# Patient Record
Sex: Male | Born: 1985 | Race: Black or African American | Hispanic: No | Marital: Single | Smoking: Never smoker
Health system: Southern US, Community
[De-identification: ages and names within clinical notes are randomized; demographics above are authoritative.]

---
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
## 2008-06-21 ENCOUNTER — Ambulatory Visit: Payer: Self-pay | Admitting: Diagnostic Radiology

## 2008-06-21 ENCOUNTER — Emergency Department (HOSPITAL_BASED_OUTPATIENT_CLINIC_OR_DEPARTMENT_OTHER): Admission: EM | Admit: 2008-06-21 | Discharge: 2008-06-21 | Payer: Self-pay | Admitting: Emergency Medicine

## 2013-09-20 ENCOUNTER — Encounter (HOSPITAL_BASED_OUTPATIENT_CLINIC_OR_DEPARTMENT_OTHER): Payer: Self-pay | Admitting: Emergency Medicine

## 2013-09-20 ENCOUNTER — Emergency Department (HOSPITAL_BASED_OUTPATIENT_CLINIC_OR_DEPARTMENT_OTHER)
Admission: EM | Admit: 2013-09-20 | Discharge: 2013-09-21 | Disposition: A | Discharge status: Home / Self Care | Payer: Worker's Compensation | Attending: Emergency Medicine | Admitting: Emergency Medicine

## 2013-09-20 ENCOUNTER — Emergency Department (HOSPITAL_BASED_OUTPATIENT_CLINIC_OR_DEPARTMENT_OTHER): Payer: Worker's Compensation

## 2013-09-20 DIAGNOSIS — W010XXA Fall on same level from slipping, tripping and stumbling without subsequent striking against object, initial encounter: Secondary | ICD-10-CM | POA: Insufficient documentation

## 2013-09-20 DIAGNOSIS — S8392XA Sprain of unspecified site of left knee, initial encounter: Secondary | ICD-10-CM

## 2013-09-20 DIAGNOSIS — S8990XA Unspecified injury of unspecified lower leg, initial encounter: Secondary | ICD-10-CM | POA: Diagnosis present

## 2013-09-20 DIAGNOSIS — Y990 Civilian activity done for income or pay: Secondary | ICD-10-CM | POA: Insufficient documentation

## 2013-09-20 DIAGNOSIS — Y9289 Other specified places as the place of occurrence of the external cause: Secondary | ICD-10-CM | POA: Diagnosis not present

## 2013-09-20 DIAGNOSIS — IMO0002 Reserved for concepts with insufficient information to code with codable children: Secondary | ICD-10-CM | POA: Insufficient documentation

## 2013-09-20 MED ORDER — NAPROXEN 250 MG PO TABS
500.0000 mg | ORAL_TABLET | Freq: Once | ORAL | Status: AC
  Administered 2013-09-21: 500 mg via ORAL
  Filled 2013-09-20: qty 2

## 2013-09-20 MED ORDER — NAPROXEN SODIUM 550 MG PO TABS: ORAL_TABLET | ORAL | Status: AC

## 2013-09-20 NOTE — ED Provider Notes (Signed)
CSN: [id number]     Arrival date & time 09/20/13  2237 History  This chart was scribed for Hector SeamenJohn L Camala Talwar, MD by Bronson CurbJacqueline Melvin, ED Scribe. This patient was seen in room MH04/MH04 and the patient's care was started at 11:52 PM.     Chief Complaint  Patient presents with  . Knee Injury     The history is provided by the patient. No language interpreter was used.    HPI Comments: Hector Hendrix is a 28 y.o. male who presents to the Emergency Department complaining of left knee injury that occurred at approximately 1800. Patient states he slipped on some water at work and fell. There is associated mild left knee pain when ambulating. He denies any other injuries. Patient has no history of significant health conditions.  History reviewed. No pertinent past medical history. History reviewed. No pertinent past surgical history. No family history on file. History  Substance Use Topics  . Smoking status: Never Smoker   . Smokeless tobacco: Not on file  . Alcohol Use: No    Review of Systems A complete 10 system review of systems was obtained and all systems are negative except as noted in the HPI and PMH.   Allergies  Review of patient's allergies indicates no known allergies.  Home Medications   Prior to Admission medications   Medication Sig Start Date End Date Taking? Authorizing Provider  naproxen sodium (ANAPROX DS) 550 MG tablet Take one tablet every 12 hours as needed for pain. Best taken with a meal. 09/20/13   Carlisle BeersJohn L Deniz Eskridge, MD   Triage Vitals: BP 130/94  Pulse 74  Temp(Src) 98.4 F (36.9 C) (Oral)  Resp 18  Ht 5\' 11"  (1.803 m)  Wt 180 lb (81.647 kg)  BMI 25.12 kg/m2  SpO2 100%  Physical Exam  Nursing note and vitals reviewed.  General: Well-developed, well-nourished male in no acute distress; appearance consistent with age of record HENT: normocephalic; atraumatic Eyes: pupils equal, round and reactive to light; extraocular muscles intact Neck: supple Heart:  regular rate and rhythm Lungs: clear to auscultation bilaterally Abdomen: soft; nondistended; nontender; no masses or hepatosplenomegaly; bowel sounds present Extremities: No deformity; full range of motion; pulses normal. Left knee is stable with mild infrapatellar tenderness. Neurologic: Awake, alert and oriented; motor function intact in all extremities and symmetric; no facial droop Skin: Warm and dry Psychiatric: Normal mood and affect   ED Course  Procedures (including critical care time)  DIAGNOSTIC STUDIES: Oxygen Saturation is 100% on room air, normal by my interpretation.    COORDINATION OF CARE: 11:56 PM- Pt advised of plan for treatment and pt agrees.    MDM  Nursing notes and vitals signs, including pulse oximetry, reviewed.  Summary of this visit's results, reviewed by myself:  Imaging Studies: Dg Knee Complete 4 Views Left  09/20/2013   CLINICAL DATA:  Diffuse knee pain after falling at work today.  EXAM: LEFT KNEE - COMPLETE 4+ VIEW  COMPARISON:  06/21/2008.  FINDINGS: The mineralization and alignment are normal. There is no evidence of acute fracture or dislocation. The joint spaces are maintained. There are chronic endosteal lesions within the distal femur and proximal tibia which are unchanged. A small joint effusion is likely.  IMPRESSION: No acute osseous findings. Possible small joint effusion. Stable underlying fibroxanthomas.   Electronically Signed   By: Roxy HorsemanBill  Veazey M.D.   On: 09/20/2013 23:23    Final diagnoses:  Sprain of left knee, initial encounter   I personally  performed the services described in this documentation, which was scribed in my presence. The recorded information has been reviewed and is accurate.   Carlisle Beers Milam Allbaugh, MD 09/21/13 0001

## 2013-09-20 NOTE — ED Notes (Signed)
Slipped at work and fell this evening. Pain in his left knee.

## 2013-10-06 ENCOUNTER — Encounter: Payer: Self-pay | Admitting: Family Medicine

## 2013-10-06 ENCOUNTER — Ambulatory Visit (INDEPENDENT_AMBULATORY_CARE_PROVIDER_SITE_OTHER): Payer: Worker's Compensation | Admitting: Family Medicine

## 2013-10-06 VITALS — BP 117/78 | HR 88 | Wt 180.0 lb

## 2013-10-06 DIAGNOSIS — S99929A Unspecified injury of unspecified foot, initial encounter: Secondary | ICD-10-CM

## 2013-10-06 DIAGNOSIS — S8992XA Unspecified injury of left lower leg, initial encounter: Secondary | ICD-10-CM

## 2013-10-06 DIAGNOSIS — S8990XA Unspecified injury of unspecified lower leg, initial encounter: Secondary | ICD-10-CM | POA: Diagnosis not present

## 2013-10-06 DIAGNOSIS — S99919A Unspecified injury of unspecified ankle, initial encounter: Secondary | ICD-10-CM

## 2013-10-06 NOTE — Patient Instructions (Signed)
You at minimum tore meniscus in your knee. We will go ahead with an MRI of your knee - I will call you the business day following this to go over results. Icing 15 minutes at a time 3-4 times a day. Brace for support. Ok to continue working in the meantime. Aleve 2 tabs twice a day with food OR ibuprofen 3 tabs three times a day with food for pain and inflammation. Follow up will depend on the MRI results.

## 2013-10-10 ENCOUNTER — Encounter: Payer: Self-pay | Admitting: Family Medicine

## 2013-10-10 ENCOUNTER — Other Ambulatory Visit: Payer: Self-pay | Admitting: *Deleted

## 2013-10-10 DIAGNOSIS — S8992XA Unspecified injury of left lower leg, initial encounter: Secondary | ICD-10-CM

## 2013-10-10 NOTE — Progress Notes (Signed)
Patient ID: Hector Hendrix, male   DOB: 11/24/1985, 28 y.o.   MRN: [id number]  PCP: No primary provider on file.  Subjective:   HPI: Patient is a 28 y.o. male here for left knee injury.  Patient reports on 7/21 while at work at [employer] he slipped on water on a tile floor and hyperextended his left knee. No prior injuries. + swelling but no bruising. Wearing knee brace since then. Feels a little unstable. No catching or locking.  History reviewed. No pertinent past medical history.  Current Outpatient Prescriptions on File Prior to Visit  Medication Sig Dispense Refill  . naproxen sodium (ANAPROX DS) 550 MG tablet Take one tablet every 12 hours as needed for pain. Best taken with a meal.  20 tablet  0   No current facility-administered medications on file prior to visit.    History reviewed. No pertinent past surgical history.  No Known Allergies  History   Social History  . Marital Status: Single    Spouse Name: N/A    Number of Children: N/A  . Years of Education: N/A   Occupational History  . Not on file.   Social History Main Topics  . Smoking status: Never Smoker   . Smokeless tobacco: Not on file  . Alcohol Use: No  . Drug Use: No  . Sexual Activity: Not on file   Other Topics Concern  . Not on file   Social History Narrative  . No narrative on file    No family history on file.  BP 117/78  Pulse 88  Wt 180 lb (81.647 kg)  Review of Systems: See HPI above.    Objective:  Physical Exam:  Gen: NAD  Left knee: Mod effusion.  No bruising, other deformity. Medial, lateral joint line and suprapatellar tenderness.  ROM 0 - 110 degrees - limited by swelling. 1+ ant drawer, negative post drawer.  Negative valgus/varus testing. Negative lachmanns. Positive mcmurrays, apleys.  Negative patellar apprehension. NV intact distally.    Assessment & Plan:  1. Left knee injury - concerning for at least meniscus tear.  Possible ACL tear based on exam, type  of injury and instability.  Will go ahead with MRI to confirm, refer to orthopedic surgery if confirmed.  In meantime brace, icing, nsaids.

## 2013-10-10 NOTE — Assessment & Plan Note (Signed)
concerning for at least meniscus tear.  Possible ACL tear based on exam, type of injury and instability.  Will go ahead with MRI to confirm, refer to orthopedic surgery if confirmed.  In meantime brace, icing, nsaids.

## 2014-01-04 ENCOUNTER — Ambulatory Visit (INDEPENDENT_AMBULATORY_CARE_PROVIDER_SITE_OTHER): Payer: Worker's Compensation | Admitting: Family Medicine

## 2014-01-04 DIAGNOSIS — S83512S Sprain of anterior cruciate ligament of left knee, sequela: Secondary | ICD-10-CM

## 2014-01-04 DIAGNOSIS — S8992XD Unspecified injury of left lower leg, subsequent encounter: Secondary | ICD-10-CM

## 2014-01-04 NOTE — Patient Instructions (Signed)
Start physical therapy and do home exercises on days you don't go to physical therapy. Follow up with me about 6 weeks following this for reevaluation.

## 2014-01-09 NOTE — Progress Notes (Signed)
Patient ID: Hector Hendrix, male   DOB: 10/09/1985, 28 y.o.   MRN: [id number]  PCP: No primary care provider on file.  Subjective:   HPI: Patient is a 28 y.o. male here for left knee injury.  8/6: Patient reports on 7/21 while at work at [employer] he slipped on water on a tile floor and hyperextended his left knee. No prior injuries. + swelling but no bruising. Wearing knee brace since then. Feels a little unstable. No catching or locking.  11/4: Patient returns to review MRI and to reevaluate/reexamine. Reports pain comes on with certain activities, prolonged walking, standing, side to side motions. No pain currently. No swelling any longer. Has not given out, no catching or locking.  No past medical history on file.  Current Outpatient Prescriptions on File Prior to Visit  Medication Sig Dispense Refill  . naproxen sodium (ANAPROX DS) 550 MG tablet Take one tablet every 12 hours as needed for pain. Best taken with a meal. 20 tablet 0   No current facility-administered medications on file prior to visit.    No past surgical history on file.  No Known Allergies  History   Social History  . Marital Status: Single    Spouse Name: N/A    Number of Children: N/A  . Years of Education: N/A   Occupational History  . Not on file.   Social History Main Topics  . Smoking status: Never Smoker   . Smokeless tobacco: Not on file  . Alcohol Use: No  . Drug Use: No  . Sexual Activity: Not on file   Other Topics Concern  . Not on file   Social History Narrative  . No narrative on file    No family history on file.  There were no vitals taken for this visit.  Review of Systems: See HPI above.    Objective:  Physical Exam:  Gen: NAD  Left knee: No effusion, bruising, other deformity. No joint line or post patellar facet tenderness.  FROM. Trace ant drawer, negative post drawer.  Negative valgus/varus testing. Negative lachmanns. Negative mcmurrays, apleys.   Negative patellar apprehension. NV intact distally.    Assessment & Plan:  1. Left knee injury - MRI performed 01/01/2014 shows degeneration of ACL with likely partial tear - agree with this finding from his accident.  Believe he has some fibers intact given his exam.  F/u in 6 weeks to reevaluate.

## 2014-01-09 NOTE — Assessment & Plan Note (Signed)
MRI performed 01/01/2014 shows degeneration of ACL with likely partial tear - agree with this finding from his accident.  Believe he has some fibers intact given his exam.  F/u in 6 weeks to reevaluate.

## 2014-01-31 ENCOUNTER — Encounter: Payer: Self-pay | Admitting: Family Medicine

## 2014-02-15 ENCOUNTER — Ambulatory Visit: Payer: Self-pay | Admitting: Family Medicine

## 2014-02-21 ENCOUNTER — Encounter: Payer: Self-pay | Admitting: Family Medicine

## 2014-02-21 ENCOUNTER — Ambulatory Visit (INDEPENDENT_AMBULATORY_CARE_PROVIDER_SITE_OTHER): Payer: Worker's Compensation | Admitting: Family Medicine

## 2014-02-21 VITALS — BP 143/74 | HR 80 | Wt 180.0 lb

## 2014-02-21 DIAGNOSIS — S8992XD Unspecified injury of left lower leg, subsequent encounter: Secondary | ICD-10-CM

## 2014-02-21 NOTE — Patient Instructions (Signed)
Continue with physical therapy as you transition to a home exercise program. When you do, do the home exercises 3-4 times a week until I see you back. Follow up with me in 3 months. Call me if you have any problems in the meantime.

## 2014-02-21 NOTE — Progress Notes (Signed)
Patient ID: Hector Hendrix, male   DOB: 06/12/1985, 28 y.o.   MRN: [id number]  PCP: No primary care provider on file.  Subjective:   HPI: Patient is a [age] y.o. male here for left knee injury.  8/6: Patient reports on 7/21 while at work at [employer] he slipped on water on a tile floor and hyperextended his left knee. No prior injuries. + swelling but no bruising. Wearing knee brace since then. Feels a little unstable. No catching or locking.  11/4: Patient returns to review MRI and to reevaluate/reexamine. Reports pain comes on with certain activities, prolonged walking, standing, side to side motions. No pain currently. No swelling any longer. Has not given out, no catching or locking.  12/22: Patient reports he is doing well. Gets mild pain that comes and goes. Not playing sports. No catching, locking, giving out. No swelling. Doing physical therapy and home exercises.  No past medical history on file.  Current Outpatient Prescriptions on File Prior to Visit  Medication Sig Dispense Refill  . naproxen sodium (ANAPROX DS) 550 MG tablet Take one tablet every 12 hours as needed for pain. Best taken with a meal. 20 tablet 0   No current facility-administered medications on file prior to visit.    No past surgical history on file.  No Known Allergies  History   Social History  . Marital Status: Single    Spouse Name: N/A    Number of Children: N/A  . Years of Education: N/A   Occupational History  . Not on file.   Social History Main Topics  . Smoking status: Never Smoker   . Smokeless tobacco: Not on file  . Alcohol Use: No  . Drug Use: No  . Sexual Activity: Not on file   Other Topics Concern  . Not on file   Social History Narrative    No family history on file.  BP 143/74 mmHg  Pulse 80  Wt 180 lb (81.647 kg)  Review of Systems: See HPI above.    Objective:  Physical Exam:  Gen: NAD  Left knee: No effusion, bruising, other deformity. No  joint line or post patellar facet tenderness.  FROM. 1+ ant drawer, negative post drawer.  Negative valgus/varus testing. Negative lachmanns. Negative mcmurrays, apleys.  Negative patellar apprehension. NV intact distally.    Assessment & Plan:  1. Left knee injury - MRI performed 01/01/2014 shows degeneration of ACL with likely partial tear - agree with this finding from his accident.  Had no other injury to this knee to account for these findings previously.  Will reassess his status at follow-up in 3 months.  Continue with PT and over next few weeks will likely transition to HEP.  Discussed there is a possibility he would need surgical intervention for this though symptomatically he has done well to date.

## 2014-02-21 NOTE — Assessment & Plan Note (Signed)
MRI performed 01/01/2014 shows degeneration of ACL with likely partial tear - agree with this finding from his accident.  Had no other injury to this knee to account for these findings previously.  Will reassess his status at follow-up in 3 months.  Continue with PT and over next few weeks will likely transition to HEP.  Discussed there is a possibility he would need surgical intervention for this though symptomatically he has done well to date.

## 2014-05-23 ENCOUNTER — Ambulatory Visit: Payer: Self-pay | Admitting: Family Medicine

## 2014-05-24 ENCOUNTER — Ambulatory Visit: Payer: Worker's Compensation | Admitting: Family Medicine

## 2014-05-31 ENCOUNTER — Encounter: Payer: Self-pay | Admitting: Family Medicine

## 2014-05-31 ENCOUNTER — Ambulatory Visit (INDEPENDENT_AMBULATORY_CARE_PROVIDER_SITE_OTHER): Payer: Worker's Compensation | Admitting: Family Medicine

## 2014-05-31 VITALS — BP 135/82 | HR 78 | Ht 68.0 in | Wt 190.0 lb

## 2014-05-31 DIAGNOSIS — S8992XD Unspecified injury of left lower leg, subsequent encounter: Secondary | ICD-10-CM

## 2014-06-05 NOTE — Progress Notes (Signed)
Patient ID: Tacey HeapOryan Matassa, male   DOB: 12/20/1985, 29 y.o.   MRN: [id number]  PCP: No primary care provider on file.  Subjective:   HPI: Patient is a 29 y.o. male here for left knee injury.  8/6: Patient reports on 7/21 while at work at [employer] he slipped on water on a tile floor and hyperextended his left knee. No prior injuries. + swelling but no bruising. Wearing knee brace since then. Feels a little unstable. No catching or locking.  11/4: Patient returns to review MRI and to reevaluate/reexamine. Reports pain comes on with certain activities, prolonged walking, standing, side to side motions. No pain currently. No swelling any longer. Has not given out, no catching or locking.  12/22: Patient reports he is doing well. Gets mild pain that comes and goes. Not playing sports. No catching, locking, giving out. No swelling. Doing physical therapy and home exercises.  05/31/14: Patient reports he is doing well. Sometimes gets pain in right knee. No catching, locking, giving out. Is about to return to working at [employer] - hasn't done anything to date to stress this knee. No other complaints.  No past medical history on file.  Current Outpatient Prescriptions on File Prior to Visit  Medication Sig Dispense Refill  . naproxen sodium (ANAPROX DS) 550 MG tablet Take one tablet every 12 hours as needed for pain. Best taken with a meal. 20 tablet 0   No current facility-administered medications on file prior to visit.    No past surgical history on file.  No Known Allergies  History   Social History  . Marital Status: Single    Spouse Name: N/A  . Number of Children: N/A  . Years of Education: N/A   Occupational History  . Not on file.   Social History Main Topics  . Smoking status: Never Smoker   . Smokeless tobacco: Not on file  . Alcohol Use: No  . Drug Use: No  . Sexual Activity: Not on file   Other Topics Concern  . Not on file   Social History  Narrative    No family history on file.  BP 135/82 mmHg  Pulse 78  Ht 5\' 8"  (1.727 m)  Wt 190 lb (86.183 kg)  BMI 28.90 kg/m2  Review of Systems: See HPI above.    Objective:  Physical Exam:  Gen: NAD  Left knee: No effusion, bruising, other deformity. No joint line or post patellar facet tenderness.  FROM. 1+ ant drawer, negative post drawer.  Negative valgus/varus testing. Negative lachmanns. Negative mcmurrays, apleys.  Negative patellar apprehension. NV intact distally.    Assessment & Plan:  1. Left knee injury - MRI performed 01/01/2014 shows degeneration of ACL with likely partial tear - agree with this finding from his accident.  Had no other injury to this knee to account for these findings previously.  He has intermittent pain though no instability.  Biggest test will be as he returns to working at [employer].  If he does well out to his 1 year followup in July, would release him.

## 2014-06-05 NOTE — Assessment & Plan Note (Signed)
MRI performed 01/01/2014 shows degeneration of ACL with likely partial tear - agree with this finding from his accident.  Had no other injury to this knee to account for these findings previously.  He has intermittent pain though no instability.  Biggest test will be as he returns to working at [employer].  If he does well out to his 1 year followup in July, would release him.

## 2014-09-11 ENCOUNTER — Encounter: Payer: Self-pay | Admitting: Family Medicine

## 2014-09-11 ENCOUNTER — Encounter: Payer: Worker's Compensation | Admitting: Family Medicine

## 2015-06-02 IMAGING — CR DG KNEE COMPLETE 4+V*L*
4 series · 4 of 4 positions shown · non-contrast
Comparison: 06/21/2008.

CLINICAL DATA: Diffuse knee pain after falling at work today.

EXAM:
LEFT KNEE - COMPLETE 4+ VIEW

[t knee ap left]
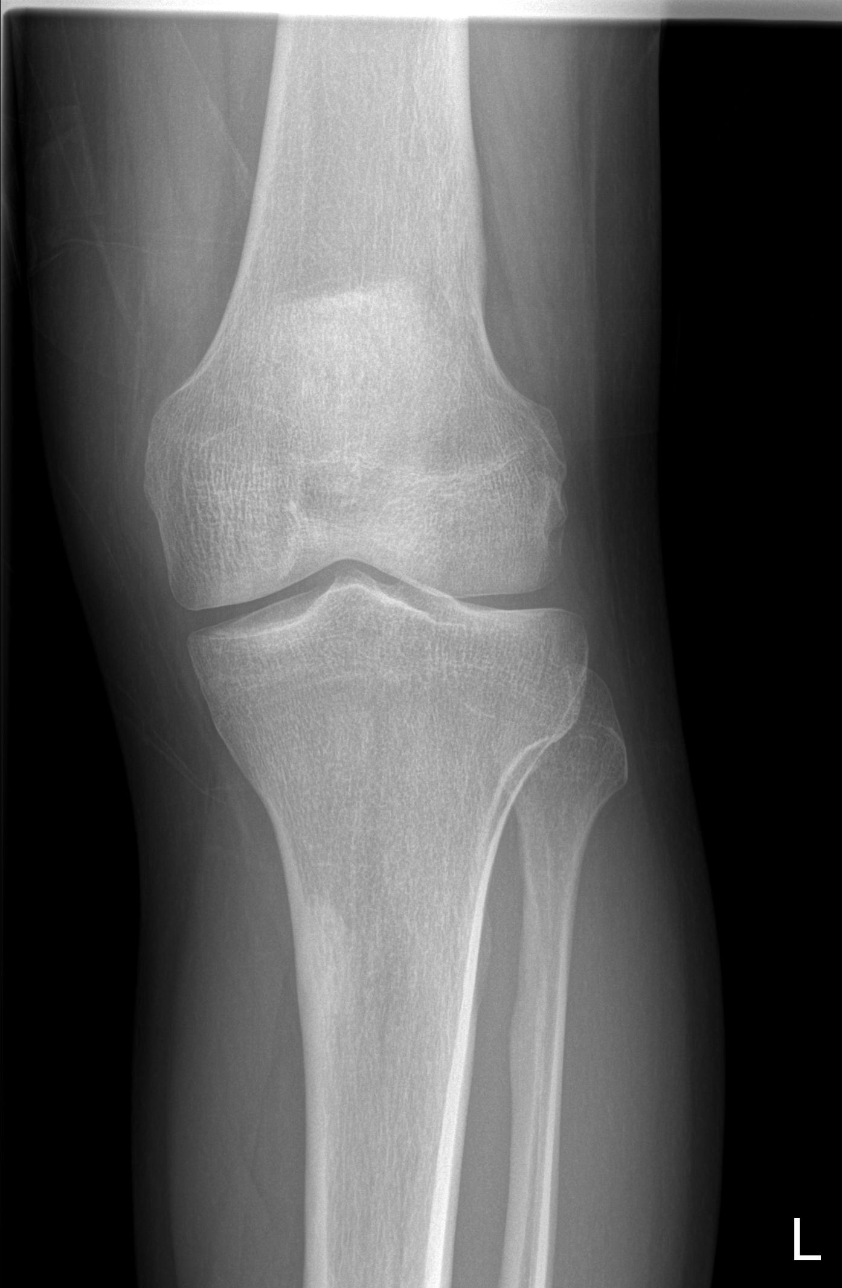

[t knee oblique left (1 of 2)]
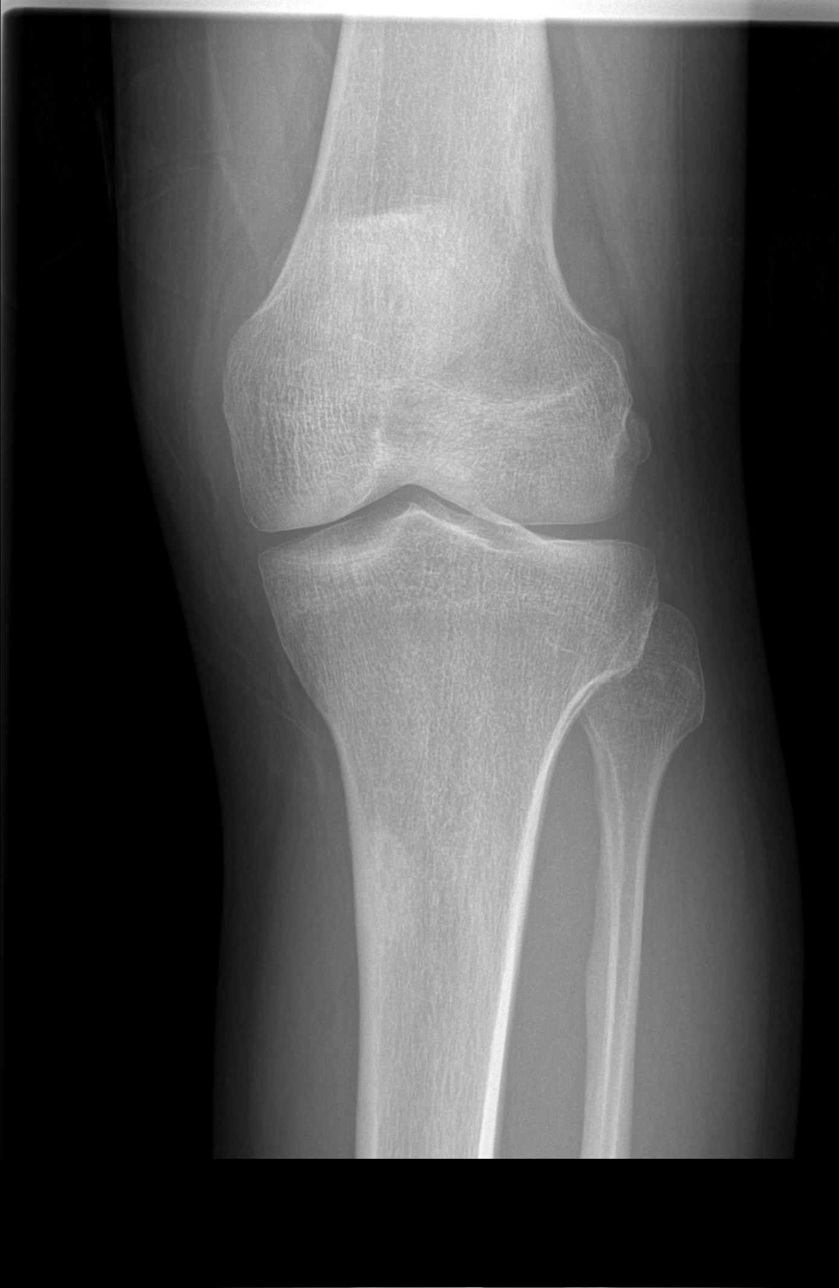

[t knee oblique left (2 of 2)]
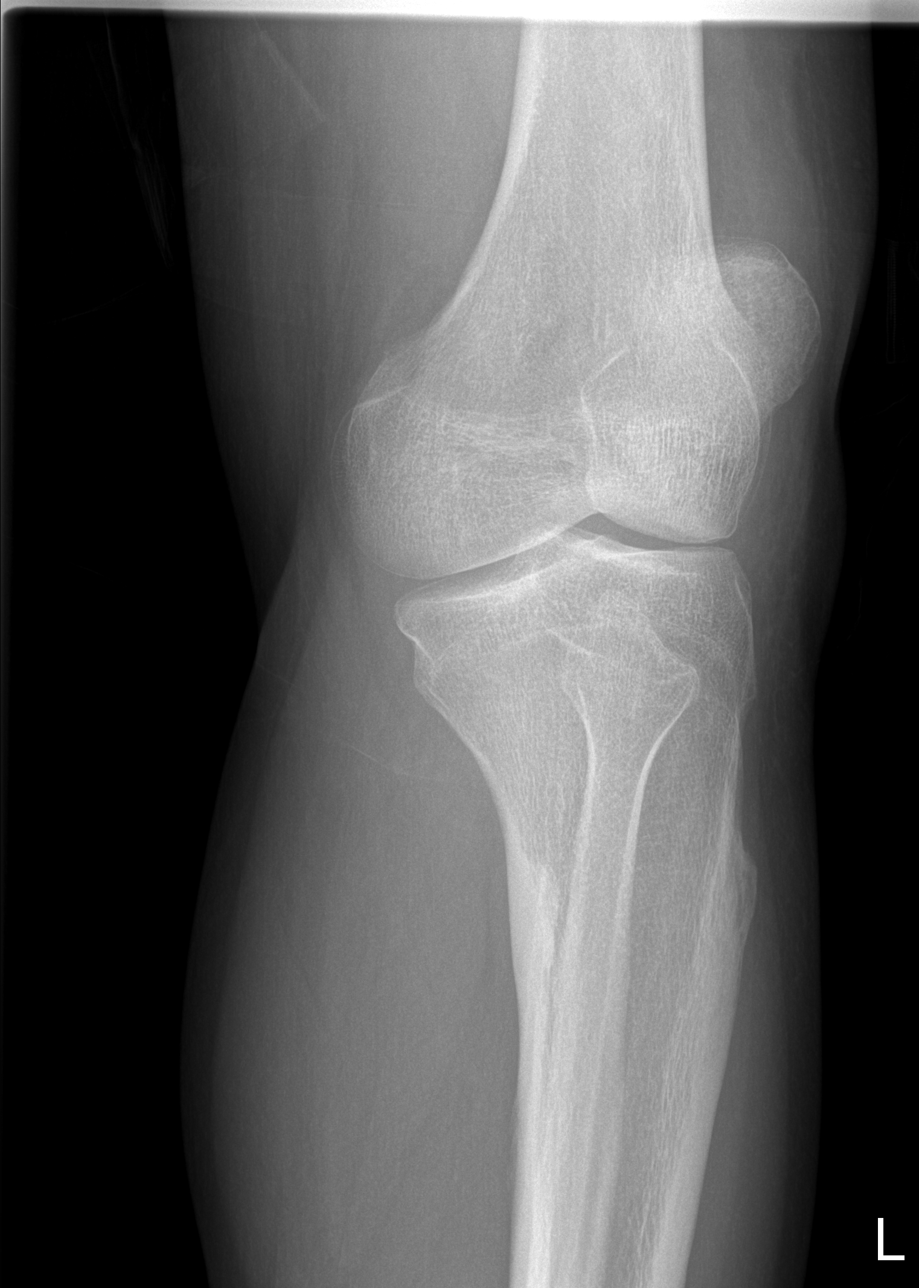

[t knee lat left]
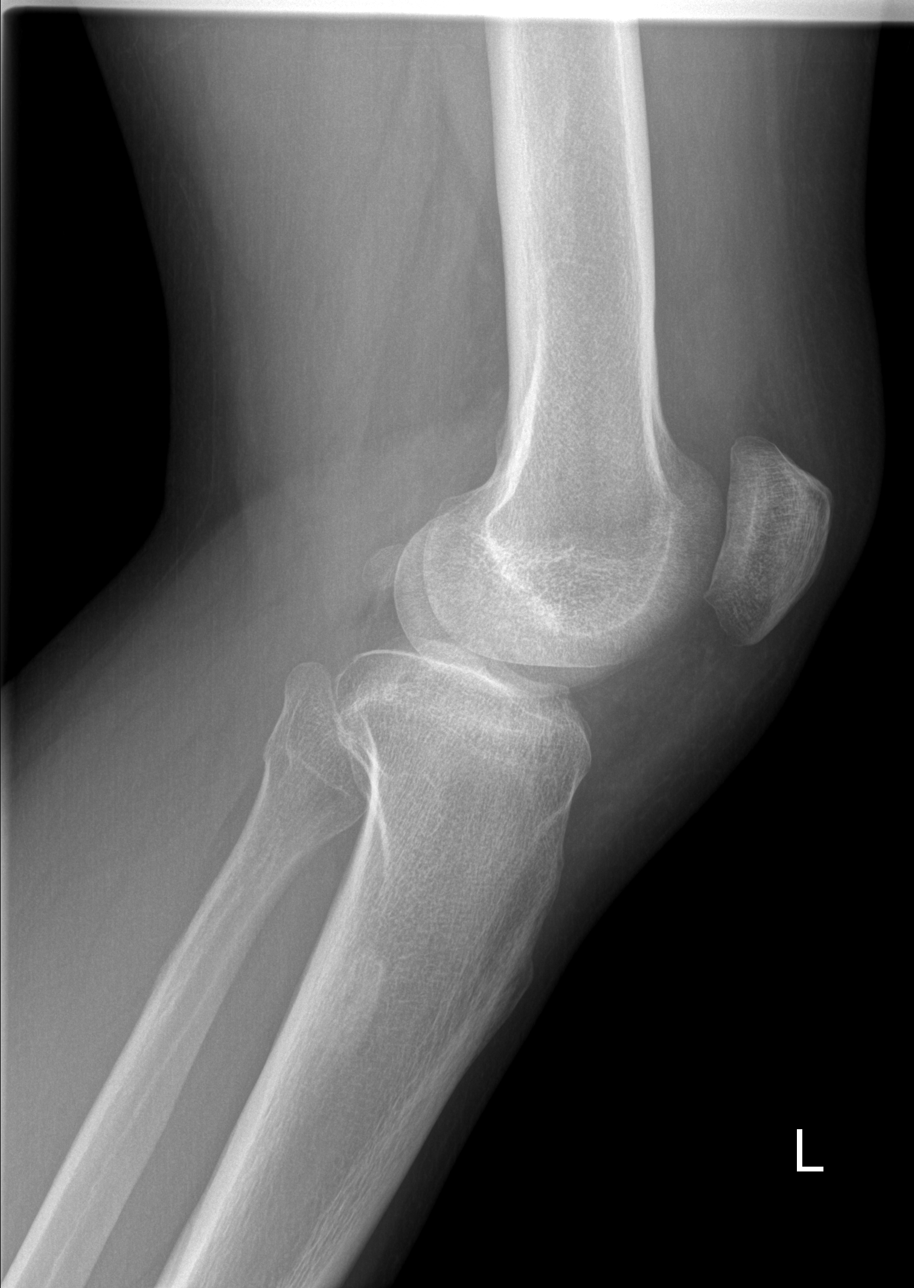

[4 of 4 positions shown; findings below may reference images not displayed]

FINDINGS: The mineralization and alignment are normal. There is no evidence of
acute fracture or dislocation. The joint spaces are maintained.
There are chronic endosteal lesions within the distal femur and
proximal tibia which are unchanged. A small joint effusion is
likely.
IMPRESSION: No acute osseous findings. Possible small joint effusion. Stable
underlying fibroxanthomas.

## 2017-04-11 ENCOUNTER — Encounter (HOSPITAL_BASED_OUTPATIENT_CLINIC_OR_DEPARTMENT_OTHER): Payer: Self-pay

## 2017-04-11 ENCOUNTER — Emergency Department (HOSPITAL_BASED_OUTPATIENT_CLINIC_OR_DEPARTMENT_OTHER)
Admission: EM | Admit: 2017-04-11 | Discharge: 2017-04-11 | Disposition: A | Discharge status: Home / Self Care | Payer: BC Managed Care – PPO | Attending: Physician Assistant | Admitting: Physician Assistant

## 2017-04-11 ENCOUNTER — Other Ambulatory Visit: Payer: Self-pay

## 2017-04-11 DIAGNOSIS — B349 Viral infection, unspecified: Secondary | ICD-10-CM | POA: Diagnosis not present

## 2017-04-11 DIAGNOSIS — Z79899 Other long term (current) drug therapy: Secondary | ICD-10-CM | POA: Diagnosis not present

## 2017-04-11 DIAGNOSIS — R509 Fever, unspecified: Secondary | ICD-10-CM | POA: Diagnosis present

## 2017-04-11 LAB — URINALYSIS, ROUTINE W REFLEX MICROSCOPIC
BILIRUBIN URINE: NEGATIVE
Glucose, UA: NEGATIVE mg/dL
HGB URINE DIPSTICK: NEGATIVE
Ketones, ur: 15 mg/dL — AB
Leukocytes, UA: NEGATIVE
Nitrite: NEGATIVE
PH: 5.5 (ref 5.0–8.0)
Protein, ur: NEGATIVE mg/dL
Specific Gravity, Urine: >1.03 — ABNORMAL HIGH (ref 1.005–1.030)

## 2017-04-11 NOTE — ED Notes (Signed)
Pt given d/c instructions as per chart. Verbalizes understanding. No questions. 

## 2017-04-11 NOTE — ED Triage Notes (Signed)
Pt reports Flu like symptoms since Wednesday. C/o cough, sore throat, and body aches. Fever has been intermittent, taking Nyquil. Here to make sure he "doesn't have the flu."

## 2017-04-11 NOTE — ED Notes (Signed)
Alert, NAD, calm, interactive, resps e/u, speaking in clear complete sentences, no dyspnea noted, skin W&D, VSS, c/o cough, lower back pain, congestion, sore throat, some nausea, low grade fever, mentions diarrhea on Thursday, (denies: sob, vomiting, dizziness or visual changes). EDP into room. [facility]

## 2017-04-11 NOTE — ED Notes (Signed)
Flu-like s/s since Wed. Diarrhea and back pain onset Thursday. Son was also sick.

## 2017-04-11 NOTE — ED Provider Notes (Signed)
[facility] EMERGENCY DEPARTMENT Provider Note   CSN: [id number] Arrival date & time: 04/11/17  1835     History   Chief Complaint Chief Complaint  Patient presents with  . Influenza    HPI Hector Hendrix is a 32 y.o. male.  HPI   32 year old male with viral-like syndrome.  Patient reports congestion, small fever, feelings of fatigue.  No real myalgias.  Eating drinking normally.  Patient does have mid back pain, worse with cough.  No urinary symptoms.  Appears well on exam.  8-year-old son has been sick with similar syndrome. History reviewed. No pertinent past medical history.  Patient Active Problem List   Diagnosis Date Noted  . Left knee injury 10/10/2013    History reviewed. No pertinent surgical history.     Home Medications    Prior to Admission medications   Medication Sig Start Date End Date Taking? Authorizing Provider  naproxen sodium (ANAPROX DS) 550 MG tablet Take one tablet every 12 hours as needed for pain. Best taken with a meal. 09/20/13   Molpus, Jonny Ruiz, MD    Family History History reviewed. No pertinent family history.  Social History Social History   Tobacco Use  . Smoking status: Never Smoker  . Smokeless tobacco: Never Used  Substance Use Topics  . Alcohol use: No    Alcohol/week: 0.0 oz  . Drug use: No     Allergies   Patient has no known allergies.   Review of Systems Review of Systems  Constitutional: Positive for fatigue and fever. Negative for activity change.  HENT: Positive for congestion and sore throat.   Respiratory: Positive for cough. Negative for shortness of breath.   Cardiovascular: Negative for chest pain.  Gastrointestinal: Negative for abdominal pain.  All other systems reviewed and are negative.    Physical Exam Updated Vital Signs BP 135/84   Pulse 91   Temp (!) 100.4 F (38 C) (Oral)   Resp 18   Ht 5\' 9"  (1.753 m)   Wt 95.3 kg (210 lb)   SpO2 99%   BMI 31.01 kg/m   Physical Exam    Constitutional: He is oriented to person, place, and time. He appears well-nourished.  HENT:  Head: Normocephalic.  Right Ear: External ear normal.  Left Ear: External ear normal.  Mouth/Throat: Oropharynx is clear and moist.  Bilateral TMs normal.  No erythema of the posterior pharynx.  Eyes: Conjunctivae are normal. Right eye exhibits no discharge. Left eye exhibits no discharge.  Cardiovascular: Normal rate and regular rhythm.  No murmur heard. Pulmonary/Chest: Effort normal and breath sounds normal. No respiratory distress.  Musculoskeletal:  Pain midline in the lower back.  No CVA tenderness.  Neurological: He is oriented to person, place, and time.  Skin: Skin is warm and dry. He is not diaphoretic.  Psychiatric: He has a normal mood and affect. His behavior is normal.     ED Treatments / Results  Labs (all labs ordered are listed, but only abnormal results are displayed) Labs Reviewed  URINALYSIS, ROUTINE W REFLEX MICROSCOPIC    EKG  EKG Interpretation None       Radiology No results found.  Procedures Procedures (including critical care time)  Medications Ordered in ED Medications - No data to display   Initial Impression / Assessment and Plan / ED Course  I have reviewed the triage vital signs and the nursing notes.  Pertinent labs & imaging results that were available during my care of the patient were  reviewed by me and considered in my medical decision making (see chart for details).      32 year old male with viral-like syndrome.  Patient reports congestion, small fever, feelings of fatigue.  No real myalgias.  Eating drinking normally.  Patient does have mid back pain, worse with cough.  No urinary symptoms.  Appears well on exam.  10238-year-old son has been sick with similar syndrome.  Suspect viral syndrome.  Will make sure patient does not have infected urine.  Otherwise will have treat with ibuprofen Tylenol and supportive care at  home.  Final Clinical Impressions(s) / ED Diagnoses   Final diagnoses:  None    ED Discharge Orders    None       Abelino DerrickMackuen, Courteney Lyn, MD 04/11/17 2032

## 2017-04-11 NOTE — ED Notes (Signed)
Up to b/r, steady gait 

## 2017-04-11 NOTE — Discharge Instructions (Signed)
Please use ibuprofen and Tylenol to help with your fevers and aches.  Please do plenty of handwashing, rest and stay hydrated.
